# Patient Record
Sex: Female | Born: 1945 | Race: White | Hispanic: Yes | Marital: Married | Smoking: Never smoker
Health system: Southern US, Community
[De-identification: ages and names within clinical notes are randomized; demographics above are authoritative.]

---

## 2020-06-04 ENCOUNTER — Other Ambulatory Visit: Payer: Self-pay | Admitting: Internal Medicine

## 2020-06-04 ENCOUNTER — Ambulatory Visit: Payer: Self-pay | Attending: Internal Medicine

## 2020-06-04 DIAGNOSIS — Z23 Encounter for immunization: Secondary | ICD-10-CM

## 2020-06-04 NOTE — Progress Notes (Signed)
   Covid-19 Vaccination Clinic  Name:  Tami Walker    MRN: [id number] DOB: Oct 01, 1945  06/04/2020  [name] was observed post Covid-19 immunization for 15 minutes without incident. She was provided with Vaccine Information Sheet and instruction to access the V-Safe system.   [name] was instructed to call 911 with any severe reactions post vaccine: Marland Kitchen Difficulty breathing  . Swelling of face and throat  . A fast heartbeat  . A bad rash all over body  . Dizziness and weakness   Immunizations Administered    Name Date Dose VIS Date Route   Pfizer COVID-19 Vaccine 06/04/2020  9:18 AM 0.3 mL 05/01/2020 Intramuscular   Manufacturer: [employer], [employer]   Lot: [id number]   NDC: [address]-6

## 2020-07-17 ENCOUNTER — Encounter: Payer: Self-pay | Admitting: Internal Medicine

## 2020-07-17 ENCOUNTER — Other Ambulatory Visit: Payer: Self-pay

## 2020-07-17 ENCOUNTER — Ambulatory Visit (INDEPENDENT_AMBULATORY_CARE_PROVIDER_SITE_OTHER): Payer: Medicare HMO | Admitting: Internal Medicine

## 2020-07-17 DIAGNOSIS — E038 Other specified hypothyroidism: Secondary | ICD-10-CM | POA: Insufficient documentation

## 2020-07-17 DIAGNOSIS — E063 Autoimmune thyroiditis: Secondary | ICD-10-CM

## 2020-07-17 DIAGNOSIS — E785 Hyperlipidemia, unspecified: Secondary | ICD-10-CM | POA: Diagnosis not present

## 2020-07-17 DIAGNOSIS — J206 Acute bronchitis due to rhinovirus: Secondary | ICD-10-CM | POA: Diagnosis not present

## 2020-07-17 MED ORDER — ALBUTEROL SULFATE HFA 108 (90 BASE) MCG/ACT IN AERS: 1.0000 | INHALATION_SPRAY | Freq: Every day | RESPIRATORY_TRACT | 3 refills | Status: AC

## 2020-07-17 MED ORDER — ATORVASTATIN CALCIUM 20 MG PO TABS
20.0000 mg | ORAL_TABLET | Freq: Every day | ORAL | 1 refills | Status: DC
Start: 2020-07-17 — End: 2021-01-31

## 2020-07-17 MED ORDER — LEVOTHYROXINE SODIUM 25 MCG PO TABS: 25.0000 ug | ORAL_TABLET | Freq: Every day | ORAL | 1 refills | Status: AC

## 2020-07-17 MED ORDER — IPRATROPIUM BROMIDE 0.02 % IN SOLN: 0.2500 mg | Freq: Four times a day (QID) | RESPIRATORY_TRACT | 6 refills | Status: AC | PRN

## 2020-07-17 NOTE — Assessment & Plan Note (Signed)
Patient has been taking statin for hyperlipidemia.  She was advised to follow low-cholesterol diet.  He was also advised to walk on a daily basis.  He denies any history of chest pain never been a smoker.  She was advised to avoid cheese steak and dairy products

## 2020-07-17 NOTE — Progress Notes (Signed)
New Patient Office Visit  Subjective:  Patient ID: Tami Walker, female    DOB: 23-Sep-1945  Age: 75 y.o. MRN: [id number]  CC:  Chief Complaint  Patient presents with  . New Patient (Initial Visit)    Patient is here to establish new care with pcp    HPI Patient presents for  Chest pain  Sob  , bronchitis  And pneumonia  , seen in  Urgent care , pt does not smoke  Or drink, no h/o  Ht attack , no h/o cva , covid test  Neg  , pt is fully vaccinated  History reviewed. No pertinent past medical history.   Current Outpatient Medications:  .  amoxicillin-clavulanate (AUGMENTIN) 875-125 MG tablet, Take 1 tablet by mouth 2 (two) times daily. For 7 days, Disp: , Rfl:  .  albuterol (VENTOLIN HFA) 108 (90 Base) MCG/ACT inhaler, Inhale 1 puff into the lungs daily., Disp: 6.7 g, Rfl: 3 .  atorvastatin (LIPITOR) 20 MG tablet, Take 1 tablet (20 mg total) by mouth daily., Disp: 90 tablet, Rfl: 1 .  ipratropium (ATROVENT) 0.02 % nebulizer solution, Inhale 1.25 mLs (0.25 mg total) into the lungs 4 (four) times daily as needed., Disp: 2.5 mL, Rfl: 6 .  levothyroxine (SYNTHROID) 25 MCG tablet, Take 1 tablet (25 mcg total) by mouth daily., Disp: 30 tablet, Rfl: 1   History reviewed. No pertinent surgical history.  Family History  Problem Relation Age of Onset  . Hypertension Mother   . Diabetes Sister   . Cancer Brother     Social History   Socioeconomic History  . Marital status: Married    Spouse name: Not on file  . Number of children: Not on file  . Years of education: Not on file  . Highest education level: Not on file  Occupational History  . Not on file  Tobacco Use  . Smoking status: Never Smoker  . Smokeless tobacco: Never Used  Substance and Sexual Activity  . Alcohol use: Never  . Drug use: Never  . Sexual activity: Not Currently    Partners: Male    Birth control/protection: None  Other Topics Concern  . Not on file  Social History Narrative  . Not on file   Social  Determinants of Health   Financial Resource Strain: Not on file  Food Insecurity: Not on file  Transportation Needs: Not on file  Physical Activity: Not on file  Stress: Not on file  Social Connections: Not on file  Intimate Partner Violence: Not on file    ROS Review of Systems  Constitutional: Negative.  Negative for chills and diaphoresis.  HENT: Negative.  Negative for congestion, nosebleeds and sinus pressure.   Eyes: Negative.  Negative for pain.  Respiratory: Positive for cough and shortness of breath.   Cardiovascular: Negative.   Gastrointestinal: Negative.   Endocrine: Negative.   Genitourinary: Negative.   Musculoskeletal: Negative.   Skin: Negative.   Allergic/Immunologic: Negative.   Neurological: Negative.   Hematological: Negative.   Psychiatric/Behavioral: Negative.   All other systems reviewed and are negative.   Objective:   Today's Vitals: There were no vitals taken for this visit.  Physical Exam Constitutional:      Appearance: She is normal weight.  HENT:     Head: Normocephalic.     Right Ear: Tympanic membrane normal.     Nose: No congestion.     Mouth/Throat:     Mouth: Mucous membranes are moist.  Eyes:  Pupils: Pupils are equal, round, and reactive to light.  Cardiovascular:     Heart sounds: No murmur heard.   Pulmonary:     Breath sounds: Rhonchi present. No rales.  Abdominal:     General: There is no distension.     Palpations: Abdomen is soft. There is no mass.     Tenderness: There is no abdominal tenderness. There is no guarding or rebound.     Hernia: No hernia is present.  Musculoskeletal:        General: No swelling.     Cervical back: No rigidity.  Skin:    Coloration: Skin is not jaundiced.     Findings: No bruising or lesion.  Neurological:     General: No focal deficit present.     Mental Status: She is alert.  Psychiatric:        Mood and Affect: Mood normal.        Behavior: Behavior normal.      Assessment & Plan:   Problem List Items Addressed This Visit      Respiratory   Acute bronchitis due to Rhinovirus - Primary    Patient was seen in the office with acute bronchitis she is on amoxicillin and she is doing better she is not coughing that much.  She is also using the inhaler I will see her back in 1 week for recheck.        Endocrine   Hypothyroidism due to Hashimoto's thyroiditis    Patient is taking levothyroxine 25 mcg p.o. daily.  Her medicines were refilled.  She does not have any goiter on physical examination.  When she got better we will do a TSH again.      Relevant Medications   levothyroxine (SYNTHROID) 25 MCG tablet     Other   Hyperlipidemia    Patient has been taking statin for hyperlipidemia.  She was advised to follow low-cholesterol diet.  He was also advised to walk on a daily basis.  He denies any history of chest pain never been a smoker.  She was advised to avoid cheese steak and dairy products      Relevant Medications   atorvastatin (LIPITOR) 20 MG tablet      Outpatient Encounter Medications as of 07/17/2020  Medication Sig  . amoxicillin-clavulanate (AUGMENTIN) 875-125 MG tablet Take 1 tablet by mouth 2 (two) times daily. For 7 days  . [DISCONTINUED] albuterol (VENTOLIN HFA) 108 (90 Base) MCG/ACT inhaler Inhale 1 puff into the lungs daily.  . [DISCONTINUED] levothyroxine (SYNTHROID) 25 MCG tablet Take 25 mcg by mouth daily.  Marland Kitchen albuterol (VENTOLIN HFA) 108 (90 Base) MCG/ACT inhaler Inhale 1 puff into the lungs daily.  Marland Kitchen atorvastatin (LIPITOR) 20 MG tablet Take 1 tablet (20 mg total) by mouth daily.  Marland Kitchen ipratropium (ATROVENT) 0.02 % nebulizer solution Inhale 1.25 mLs (0.25 mg total) into the lungs 4 (four) times daily as needed.  Marland Kitchen levothyroxine (SYNTHROID) 25 MCG tablet Take 1 tablet (25 mcg total) by mouth daily.  . [DISCONTINUED] atorvastatin (LIPITOR) 20 MG tablet Take 20 mg by mouth daily.  . [DISCONTINUED] ipratropium (ATROVENT)  0.02 % nebulizer solution Inhale 0.02 mLs into the lungs 4 (four) times daily as needed.   No facility-administered encounter medications on file as of 07/17/2020.    Follow-up: No follow-ups on file.  Patient will be seen back in my office in 1 week.  We will listen to the chest for clearing of the wheezing.  She does not smoke  or drink.  At the present time she is not in any acute distress.  Corky Downs, MD

## 2020-07-17 NOTE — Assessment & Plan Note (Signed)
Patient was seen in the office with acute bronchitis she is on amoxicillin and she is doing better she is not coughing that much.  She is also using the inhaler I will see her back in 1 week for recheck.

## 2020-07-17 NOTE — Assessment & Plan Note (Signed)
Patient is taking levothyroxine 25 mcg p.o. daily.  Her medicines were refilled.  She does not have any goiter on physical examination.  When she got better we will do a TSH again.

## 2020-07-24 ENCOUNTER — Ambulatory Visit: Payer: Medicare HMO | Admitting: Internal Medicine

## 2020-07-31 ENCOUNTER — Other Ambulatory Visit: Payer: Self-pay | Admitting: Infectious Diseases

## 2020-07-31 DIAGNOSIS — E039 Hypothyroidism, unspecified: Secondary | ICD-10-CM

## 2020-07-31 DIAGNOSIS — J189 Pneumonia, unspecified organism: Secondary | ICD-10-CM

## 2020-08-05 ENCOUNTER — Ambulatory Visit: Payer: Medicare HMO

## 2020-08-08 ENCOUNTER — Ambulatory Visit
Admission: RE | Admit: 2020-08-08 | Discharge: 2020-08-08 | Disposition: A | Discharge status: Home / Self Care | Payer: Medicare HMO | Source: Ambulatory Visit | Attending: Infectious Diseases | Admitting: Infectious Diseases

## 2020-08-08 ENCOUNTER — Other Ambulatory Visit: Payer: Self-pay

## 2020-08-08 DIAGNOSIS — J189 Pneumonia, unspecified organism: Secondary | ICD-10-CM

## 2020-08-08 DIAGNOSIS — E039 Hypothyroidism, unspecified: Secondary | ICD-10-CM | POA: Diagnosis present

## 2020-08-19 ENCOUNTER — Ambulatory Visit: Payer: Medicare HMO | Admitting: Internal Medicine

## 2020-08-19 ENCOUNTER — Other Ambulatory Visit: Payer: Self-pay | Admitting: Ophthalmology

## 2020-08-19 DIAGNOSIS — G453 Amaurosis fugax: Secondary | ICD-10-CM

## 2020-08-22 ENCOUNTER — Other Ambulatory Visit: Payer: Self-pay

## 2020-08-22 ENCOUNTER — Ambulatory Visit
Admission: RE | Admit: 2020-08-22 | Discharge: 2020-08-22 | Disposition: A | Discharge status: Home / Self Care | Payer: Medicare HMO | Source: Ambulatory Visit | Attending: Ophthalmology | Admitting: Ophthalmology

## 2020-08-22 DIAGNOSIS — G453 Amaurosis fugax: Secondary | ICD-10-CM

## 2020-09-10 DIAGNOSIS — I208 Other forms of angina pectoris: Secondary | ICD-10-CM | POA: Diagnosis not present

## 2020-09-10 DIAGNOSIS — E782 Mixed hyperlipidemia: Secondary | ICD-10-CM | POA: Diagnosis not present

## 2020-09-10 DIAGNOSIS — I25118 Atherosclerotic heart disease of native coronary artery with other forms of angina pectoris: Secondary | ICD-10-CM | POA: Diagnosis not present

## 2020-09-10 DIAGNOSIS — I10 Essential (primary) hypertension: Secondary | ICD-10-CM | POA: Diagnosis not present

## 2020-09-10 DIAGNOSIS — Z7689 Persons encountering health services in other specified circumstances: Secondary | ICD-10-CM | POA: Diagnosis not present

## 2020-09-10 DIAGNOSIS — R9431 Abnormal electrocardiogram [ECG] [EKG]: Secondary | ICD-10-CM | POA: Diagnosis not present

## 2020-09-10 DIAGNOSIS — J90 Pleural effusion, not elsewhere classified: Secondary | ICD-10-CM | POA: Diagnosis not present

## 2020-09-19 DIAGNOSIS — I208 Other forms of angina pectoris: Secondary | ICD-10-CM | POA: Diagnosis not present

## 2020-09-20 DIAGNOSIS — R0602 Shortness of breath: Secondary | ICD-10-CM | POA: Diagnosis not present

## 2020-09-23 DIAGNOSIS — J90 Pleural effusion, not elsewhere classified: Secondary | ICD-10-CM | POA: Diagnosis not present

## 2020-09-23 DIAGNOSIS — E039 Hypothyroidism, unspecified: Secondary | ICD-10-CM | POA: Diagnosis not present

## 2020-09-23 DIAGNOSIS — I509 Heart failure, unspecified: Secondary | ICD-10-CM | POA: Diagnosis not present

## 2020-09-23 DIAGNOSIS — G4700 Insomnia, unspecified: Secondary | ICD-10-CM | POA: Diagnosis not present

## 2020-09-23 DIAGNOSIS — E785 Hyperlipidemia, unspecified: Secondary | ICD-10-CM | POA: Diagnosis not present

## 2020-10-09 ENCOUNTER — Other Ambulatory Visit: Payer: Self-pay | Admitting: Internal Medicine

## 2020-10-09 DIAGNOSIS — J849 Interstitial pulmonary disease, unspecified: Secondary | ICD-10-CM

## 2020-10-09 DIAGNOSIS — I272 Pulmonary hypertension, unspecified: Secondary | ICD-10-CM | POA: Diagnosis not present

## 2020-10-09 DIAGNOSIS — E782 Mixed hyperlipidemia: Secondary | ICD-10-CM | POA: Diagnosis not present

## 2020-10-09 DIAGNOSIS — G4730 Sleep apnea, unspecified: Secondary | ICD-10-CM | POA: Diagnosis not present

## 2020-10-09 DIAGNOSIS — R079 Chest pain, unspecified: Secondary | ICD-10-CM | POA: Diagnosis not present

## 2020-10-09 DIAGNOSIS — I10 Essential (primary) hypertension: Secondary | ICD-10-CM | POA: Diagnosis not present

## 2020-10-09 DIAGNOSIS — I2510 Atherosclerotic heart disease of native coronary artery without angina pectoris: Secondary | ICD-10-CM | POA: Diagnosis not present

## 2020-10-22 DIAGNOSIS — E039 Hypothyroidism, unspecified: Secondary | ICD-10-CM | POA: Diagnosis not present

## 2020-10-22 DIAGNOSIS — J90 Pleural effusion, not elsewhere classified: Secondary | ICD-10-CM | POA: Diagnosis not present

## 2020-10-22 DIAGNOSIS — I509 Heart failure, unspecified: Secondary | ICD-10-CM | POA: Diagnosis not present

## 2020-10-25 ENCOUNTER — Ambulatory Visit
Admission: RE | Admit: 2020-10-25 | Discharge: 2020-10-25 | Disposition: A | Discharge status: Home / Self Care | Payer: Medicare HMO | Source: Ambulatory Visit | Attending: Internal Medicine | Admitting: Internal Medicine

## 2020-10-25 ENCOUNTER — Other Ambulatory Visit: Payer: Self-pay

## 2020-10-25 DIAGNOSIS — J849 Interstitial pulmonary disease, unspecified: Secondary | ICD-10-CM

## 2020-10-25 DIAGNOSIS — Z8701 Personal history of pneumonia (recurrent): Secondary | ICD-10-CM | POA: Diagnosis not present

## 2020-10-25 DIAGNOSIS — R0609 Other forms of dyspnea: Secondary | ICD-10-CM | POA: Diagnosis not present

## 2020-10-25 DIAGNOSIS — J8410 Pulmonary fibrosis, unspecified: Secondary | ICD-10-CM | POA: Diagnosis not present

## 2020-10-25 DIAGNOSIS — I2510 Atherosclerotic heart disease of native coronary artery without angina pectoris: Secondary | ICD-10-CM | POA: Diagnosis not present

## 2020-11-04 DIAGNOSIS — J90 Pleural effusion, not elsewhere classified: Secondary | ICD-10-CM | POA: Diagnosis not present

## 2021-01-06 DIAGNOSIS — E782 Mixed hyperlipidemia: Secondary | ICD-10-CM | POA: Diagnosis not present

## 2021-01-06 DIAGNOSIS — I10 Essential (primary) hypertension: Secondary | ICD-10-CM | POA: Diagnosis not present

## 2021-01-06 DIAGNOSIS — I2510 Atherosclerotic heart disease of native coronary artery without angina pectoris: Secondary | ICD-10-CM | POA: Diagnosis not present

## 2021-01-06 DIAGNOSIS — I272 Pulmonary hypertension, unspecified: Secondary | ICD-10-CM | POA: Diagnosis not present

## 2021-01-21 DIAGNOSIS — I340 Nonrheumatic mitral (valve) insufficiency: Secondary | ICD-10-CM | POA: Diagnosis not present

## 2021-01-21 DIAGNOSIS — R109 Unspecified abdominal pain: Secondary | ICD-10-CM | POA: Diagnosis not present

## 2021-01-21 DIAGNOSIS — I272 Pulmonary hypertension, unspecified: Secondary | ICD-10-CM | POA: Diagnosis not present

## 2021-01-21 DIAGNOSIS — I10 Essential (primary) hypertension: Secondary | ICD-10-CM | POA: Diagnosis not present

## 2021-01-21 DIAGNOSIS — K7460 Unspecified cirrhosis of liver: Secondary | ICD-10-CM | POA: Diagnosis not present

## 2021-01-21 DIAGNOSIS — I110 Hypertensive heart disease with heart failure: Secondary | ICD-10-CM | POA: Diagnosis not present

## 2021-01-21 DIAGNOSIS — I509 Heart failure, unspecified: Secondary | ICD-10-CM | POA: Diagnosis not present

## 2021-01-21 DIAGNOSIS — E039 Hypothyroidism, unspecified: Secondary | ICD-10-CM | POA: Diagnosis not present

## 2021-01-21 DIAGNOSIS — R1031 Right lower quadrant pain: Secondary | ICD-10-CM | POA: Diagnosis not present

## 2021-01-21 DIAGNOSIS — E785 Hyperlipidemia, unspecified: Secondary | ICD-10-CM | POA: Diagnosis not present

## 2021-01-31 ENCOUNTER — Other Ambulatory Visit: Payer: Self-pay | Admitting: Internal Medicine

## 2021-02-11 ENCOUNTER — Other Ambulatory Visit
Admission: RE | Admit: 2021-02-11 | Discharge: 2021-02-11 | Disposition: A | Discharge status: Home / Self Care | Payer: Medicare HMO | Source: Ambulatory Visit | Attending: Infectious Diseases | Admitting: Infectious Diseases

## 2021-02-11 ENCOUNTER — Other Ambulatory Visit: Payer: Self-pay | Admitting: Gastroenterology

## 2021-02-11 DIAGNOSIS — I10 Essential (primary) hypertension: Secondary | ICD-10-CM | POA: Diagnosis not present

## 2021-02-11 DIAGNOSIS — R932 Abnormal findings on diagnostic imaging of liver and biliary tract: Secondary | ICD-10-CM | POA: Diagnosis not present

## 2021-02-11 DIAGNOSIS — R0602 Shortness of breath: Secondary | ICD-10-CM | POA: Insufficient documentation

## 2021-02-11 DIAGNOSIS — R748 Abnormal levels of other serum enzymes: Secondary | ICD-10-CM | POA: Diagnosis not present

## 2021-02-11 DIAGNOSIS — I272 Pulmonary hypertension, unspecified: Secondary | ICD-10-CM | POA: Diagnosis not present

## 2021-02-11 DIAGNOSIS — I509 Heart failure, unspecified: Secondary | ICD-10-CM | POA: Diagnosis not present

## 2021-02-11 DIAGNOSIS — I081 Rheumatic disorders of both mitral and tricuspid valves: Secondary | ICD-10-CM | POA: Diagnosis not present

## 2021-02-11 DIAGNOSIS — R945 Abnormal results of liver function studies: Secondary | ICD-10-CM | POA: Diagnosis not present

## 2021-02-11 LAB — BRAIN NATRIURETIC PEPTIDE: B Natriuretic Peptide: 531.4 pg/mL — ABNORMAL HIGH (ref 0.0–100.0)

## 2021-02-17 ENCOUNTER — Other Ambulatory Visit
Admission: RE | Admit: 2021-02-17 | Discharge: 2021-02-17 | Disposition: A | Discharge status: Home / Self Care | Payer: Medicare HMO | Source: Ambulatory Visit | Attending: Infectious Diseases | Admitting: Infectious Diseases

## 2021-02-17 DIAGNOSIS — I340 Nonrheumatic mitral (valve) insufficiency: Secondary | ICD-10-CM | POA: Insufficient documentation

## 2021-02-17 DIAGNOSIS — I10 Essential (primary) hypertension: Secondary | ICD-10-CM | POA: Insufficient documentation

## 2021-02-17 DIAGNOSIS — R0602 Shortness of breath: Secondary | ICD-10-CM | POA: Insufficient documentation

## 2021-02-17 DIAGNOSIS — I5030 Unspecified diastolic (congestive) heart failure: Secondary | ICD-10-CM | POA: Diagnosis not present

## 2021-02-17 DIAGNOSIS — I272 Pulmonary hypertension, unspecified: Secondary | ICD-10-CM | POA: Diagnosis not present

## 2021-02-17 DIAGNOSIS — I700 Atherosclerosis of aorta: Secondary | ICD-10-CM | POA: Diagnosis not present

## 2021-02-17 LAB — BRAIN NATRIURETIC PEPTIDE: B Natriuretic Peptide: 238 pg/mL — ABNORMAL HIGH (ref 0.0–100.0)

## 2021-02-27 ENCOUNTER — Ambulatory Visit
Admission: RE | Admit: 2021-02-27 | Discharge: 2021-02-27 | Disposition: A | Discharge status: Home / Self Care | Payer: Medicare HMO | Source: Ambulatory Visit | Attending: Gastroenterology | Admitting: Gastroenterology

## 2021-02-27 DIAGNOSIS — R932 Abnormal findings on diagnostic imaging of liver and biliary tract: Secondary | ICD-10-CM | POA: Diagnosis not present

## 2021-02-27 DIAGNOSIS — R748 Abnormal levels of other serum enzymes: Secondary | ICD-10-CM | POA: Insufficient documentation

## 2021-02-27 DIAGNOSIS — K760 Fatty (change of) liver, not elsewhere classified: Secondary | ICD-10-CM | POA: Diagnosis not present

## 2021-03-06 DIAGNOSIS — E782 Mixed hyperlipidemia: Secondary | ICD-10-CM | POA: Diagnosis not present

## 2021-03-06 DIAGNOSIS — I272 Pulmonary hypertension, unspecified: Secondary | ICD-10-CM | POA: Diagnosis not present

## 2021-03-06 DIAGNOSIS — I509 Heart failure, unspecified: Secondary | ICD-10-CM | POA: Diagnosis not present

## 2021-03-06 DIAGNOSIS — K760 Fatty (change of) liver, not elsewhere classified: Secondary | ICD-10-CM | POA: Diagnosis not present

## 2021-03-06 DIAGNOSIS — I10 Essential (primary) hypertension: Secondary | ICD-10-CM | POA: Diagnosis not present

## 2021-04-01 DIAGNOSIS — I2510 Atherosclerotic heart disease of native coronary artery without angina pectoris: Secondary | ICD-10-CM | POA: Diagnosis not present

## 2021-04-01 DIAGNOSIS — J90 Pleural effusion, not elsewhere classified: Secondary | ICD-10-CM | POA: Diagnosis not present

## 2021-04-01 DIAGNOSIS — I10 Essential (primary) hypertension: Secondary | ICD-10-CM | POA: Diagnosis not present

## 2021-04-01 DIAGNOSIS — E782 Mixed hyperlipidemia: Secondary | ICD-10-CM | POA: Diagnosis not present

## 2021-04-01 DIAGNOSIS — I272 Pulmonary hypertension, unspecified: Secondary | ICD-10-CM | POA: Diagnosis not present

## 2021-04-01 DIAGNOSIS — I340 Nonrheumatic mitral (valve) insufficiency: Secondary | ICD-10-CM | POA: Diagnosis not present

## 2021-04-23 DIAGNOSIS — I081 Rheumatic disorders of both mitral and tricuspid valves: Secondary | ICD-10-CM | POA: Diagnosis not present

## 2021-04-23 DIAGNOSIS — E8889 Other specified metabolic disorders: Secondary | ICD-10-CM | POA: Diagnosis not present

## 2021-04-23 DIAGNOSIS — I509 Heart failure, unspecified: Secondary | ICD-10-CM | POA: Diagnosis not present

## 2021-04-23 DIAGNOSIS — Z8679 Personal history of other diseases of the circulatory system: Secondary | ICD-10-CM | POA: Diagnosis not present

## 2021-04-23 DIAGNOSIS — E785 Hyperlipidemia, unspecified: Secondary | ICD-10-CM | POA: Diagnosis not present

## 2021-06-03 DIAGNOSIS — K760 Fatty (change of) liver, not elsewhere classified: Secondary | ICD-10-CM | POA: Diagnosis not present

## 2021-09-09 ENCOUNTER — Other Ambulatory Visit: Payer: Self-pay | Admitting: Gastroenterology

## 2021-09-09 DIAGNOSIS — K760 Fatty (change of) liver, not elsewhere classified: Secondary | ICD-10-CM

## 2021-09-09 DIAGNOSIS — R1031 Right lower quadrant pain: Secondary | ICD-10-CM

## 2021-09-24 ENCOUNTER — Ambulatory Visit
Admission: RE | Admit: 2021-09-24 | Discharge: 2021-09-24 | Disposition: A | Discharge status: Home / Self Care | Payer: Medicare HMO | Source: Ambulatory Visit | Attending: Gastroenterology | Admitting: Gastroenterology

## 2021-09-24 ENCOUNTER — Other Ambulatory Visit: Payer: Self-pay

## 2021-09-24 DIAGNOSIS — R1031 Right lower quadrant pain: Secondary | ICD-10-CM | POA: Insufficient documentation

## 2021-09-24 DIAGNOSIS — K760 Fatty (change of) liver, not elsewhere classified: Secondary | ICD-10-CM | POA: Diagnosis present

## 2021-09-24 LAB — POCT I-STAT CREATININE: Creatinine, Ser: 0.9 mg/dL (ref 0.44–1.00)

## 2021-09-24 MED ORDER — IOHEXOL 300 MG/ML  SOLN
100.0000 mL | Freq: Once | INTRAMUSCULAR | Status: AC | PRN
  Administered 2021-09-24: 100 mL via INTRAVENOUS

## 2022-03-07 IMAGING — US US CAROTID DUPLEX BILAT
1 series · 14 of 24 positions shown · non-contrast
Comparison: None.

CLINICAL DATA: Amaurosis fugax of left eye

EXAM:
BILATERAL CAROTID DUPLEX ULTRASOUND
TECHNIQUE: Gray scale imaging, color Doppler and duplex ultrasound were
performed of bilateral carotid and vertebral arteries in the neck.

[Series 1: us carotid duplex bilat · 14 of 64 slices shown]
[im 1/64]
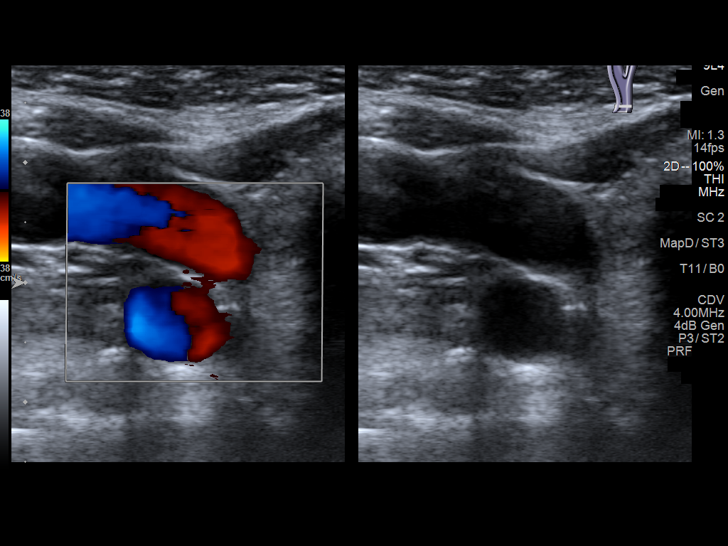
[im 6/64]
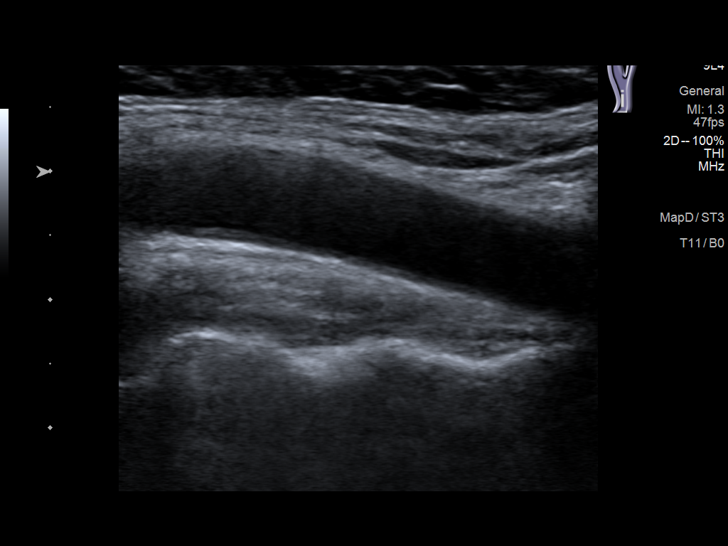
[im 11/64]
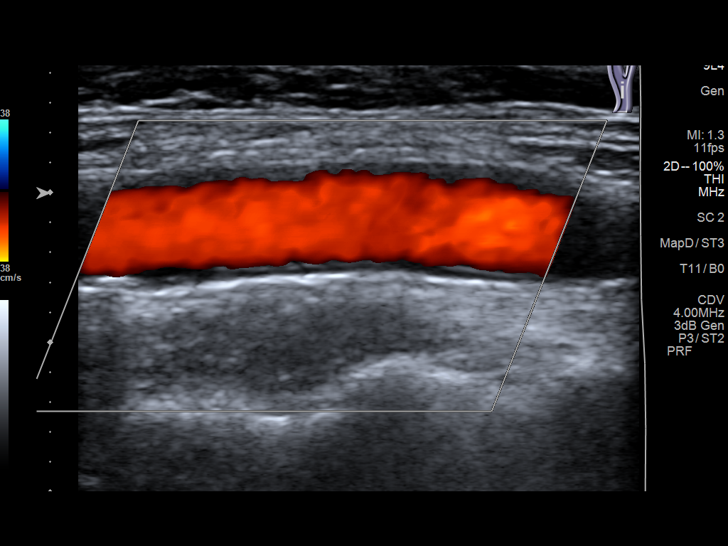
[im 17/64]
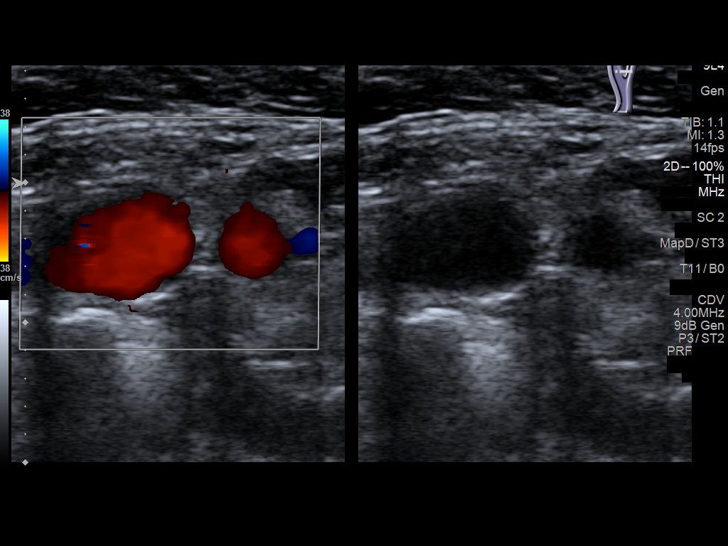
[im 20/64]
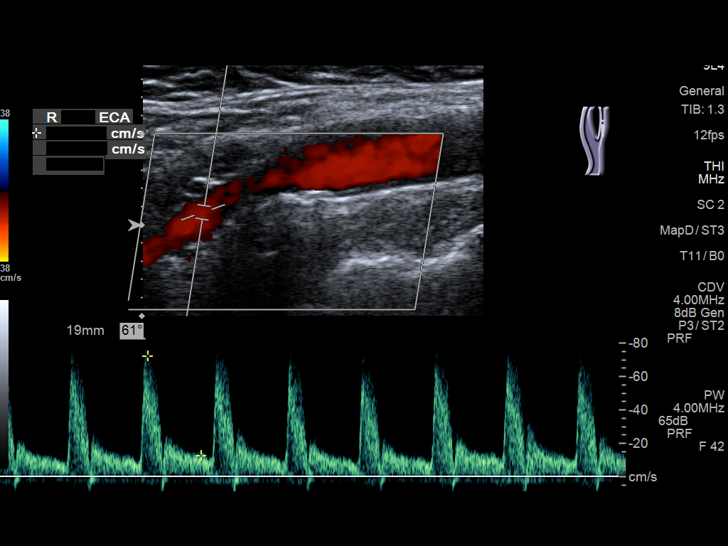
[im 25/64]
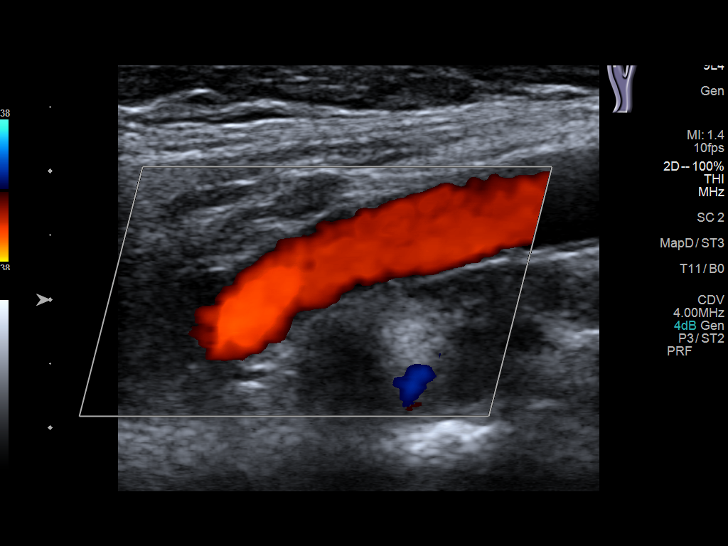
[im 31/64]
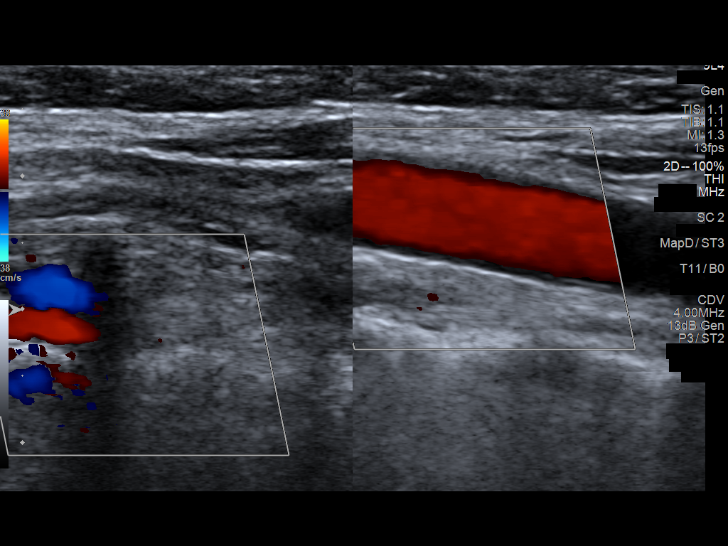
[im 33/64]
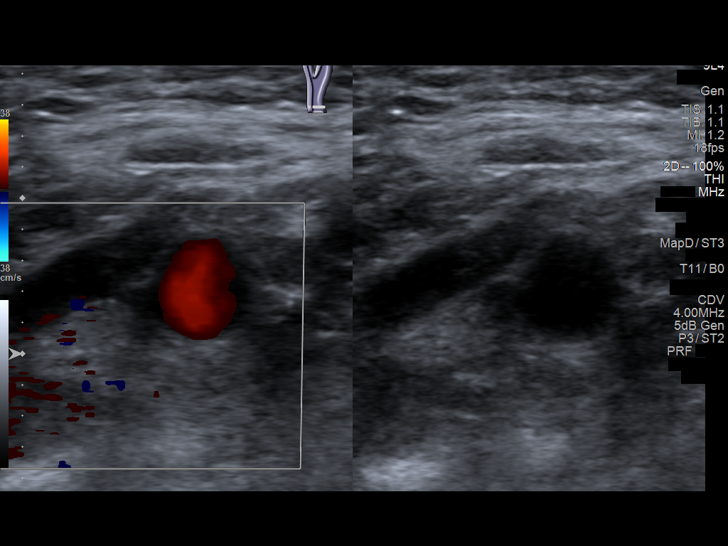
[im 39/64]
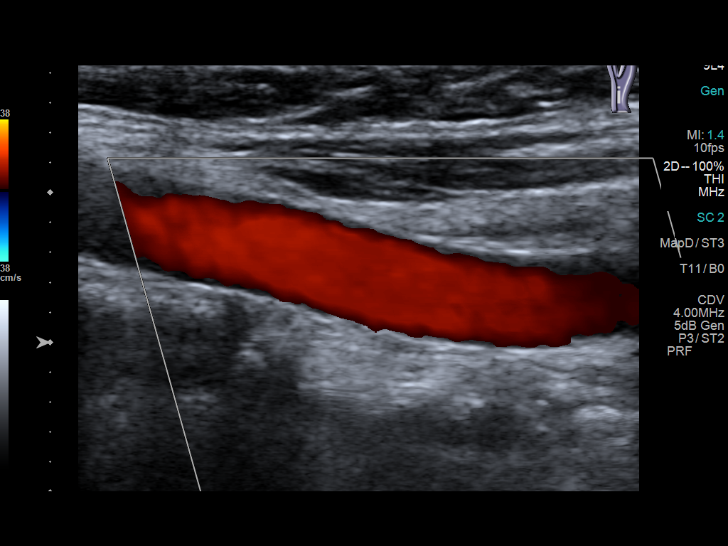
[im 44/64]
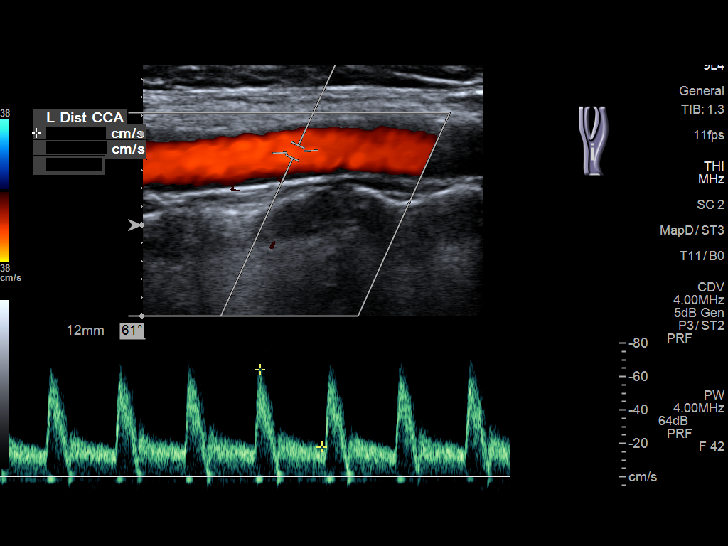
[im 50/64]
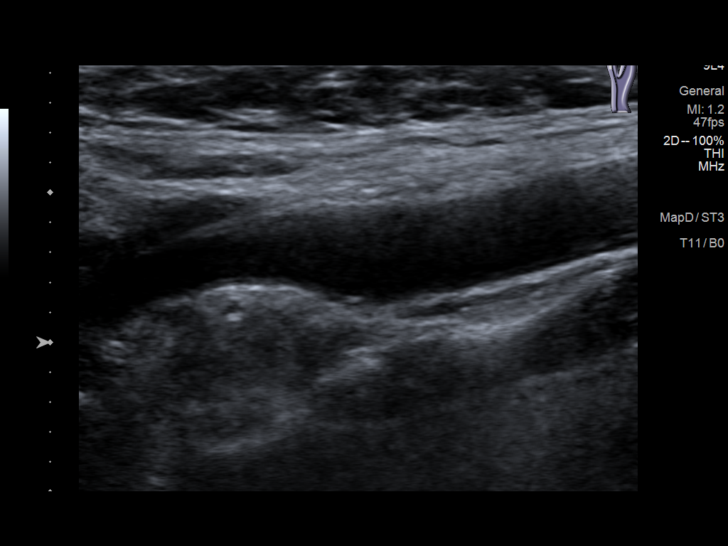
[im 53/64]
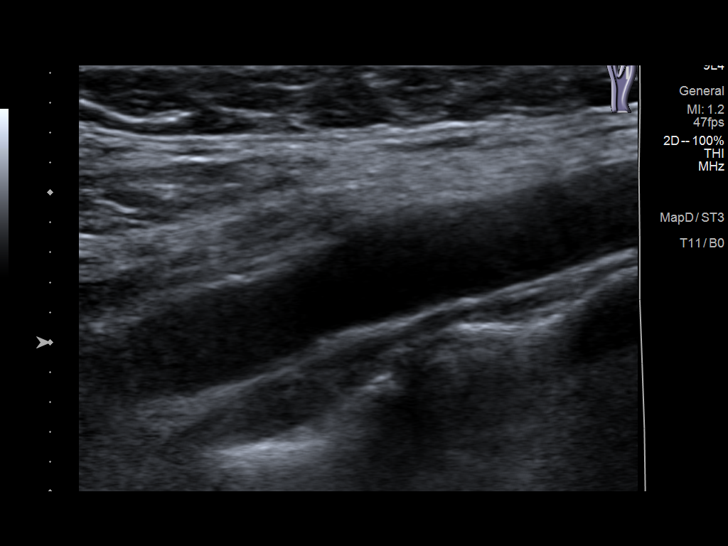
[im 58/64]
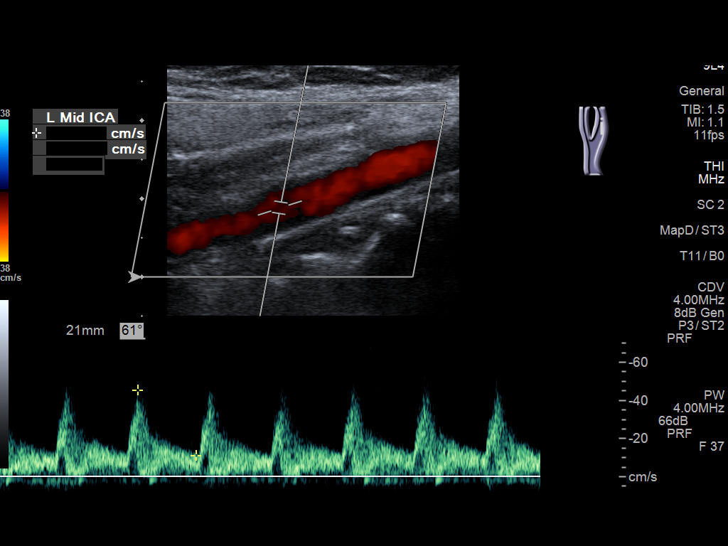
[im 64/64]
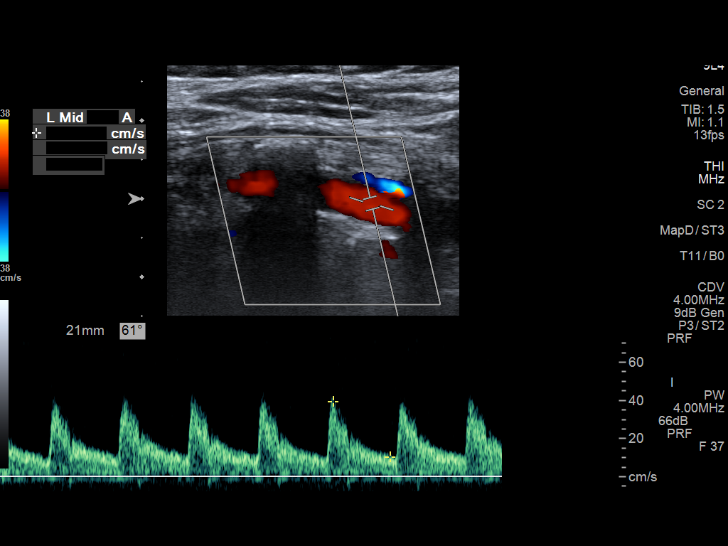

[14 of 24 positions shown; findings below may reference images not displayed]

FINDINGS: Criteria: Quantification of carotid stenosis is based on velocity
parameters that correlate the residual internal carotid diameter
with NASCET-based stenosis levels, using the diameter of the distal
internal carotid lumen as the denominator for stenosis measurement.

The following velocity measurements were obtained:

RIGHT

ICA: 65/23 cm/sec

CCA: 93/17 cm/sec

SYSTOLIC ICA/CCA RATIO:

ECA: 72 cm/sec

LEFT

ICA: 54/18 cm/sec

CCA: 64/18 cm/sec

SYSTOLIC ICA/CCA RATIO:

ECA: 81 cm/sec

RIGHT CAROTID ARTERY: Minimal atheromatous plaque of the internal
carotid artery origin without flow limiting stenosis.

RIGHT VERTEBRAL ARTERY:  Antegrade flow.

LEFT CAROTID ARTERY: Minimal atheromatous plaque of the internal
carotid artery origin without flow-limiting stenosis.

LEFT VERTEBRAL ARTERY:  Antegrade flow.
IMPRESSION: No significant narrowing of internal carotid arteries

## 2023-04-09 IMAGING — CT CT ABD-PELV W/ CM
2 of 5 series · 15 of 46 positions shown, 17 images · IV contrast (agent unspecified)
Comparison: Ultrasound February 27, 2021 and chest CT August 08, 2020

CLINICAL DATA: Intermittent right lower quadrant pain x1 year.

EXAM:
CT ABDOMEN AND PELVIS WITH CONTRAST
TECHNIQUE: Multidetector CT imaging of the abdomen and pelvis was performed
using the standard protocol following bolus administration of
intravenous contrast.

[Series 2: routine abd/pel with · axial · 0.69mm/px · z∈[-995,-635]mm · 12 of 82 slices shown, 14 images]
[im 5/82  soft-tissue]
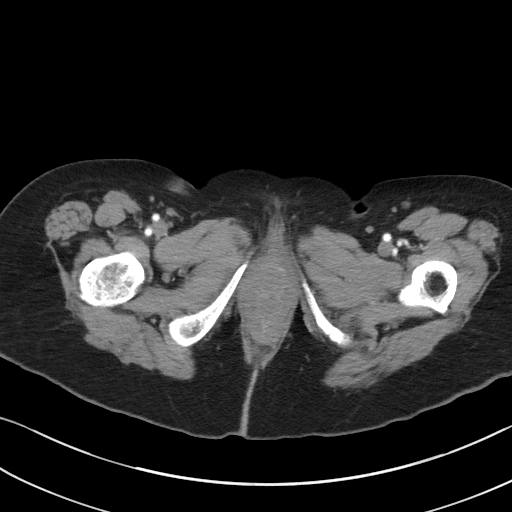
[im 5/82  bone]
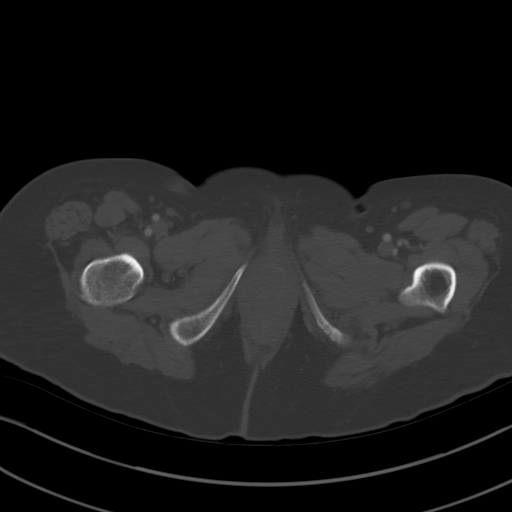
[im 13/82  soft-tissue]
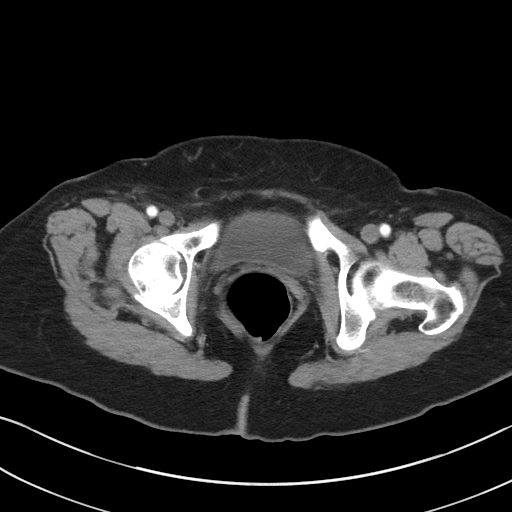
[im 18/82  soft-tissue]
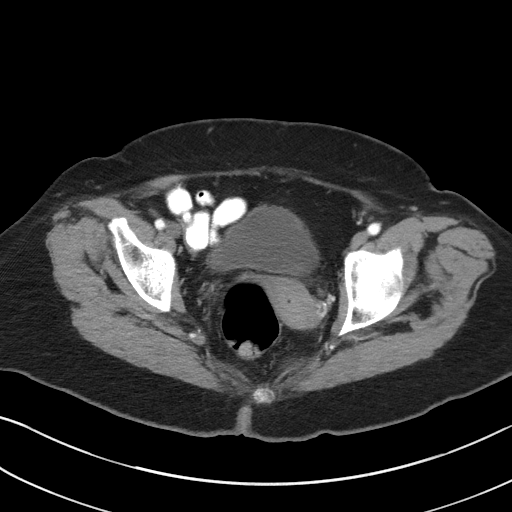
[im 26/82  soft-tissue]
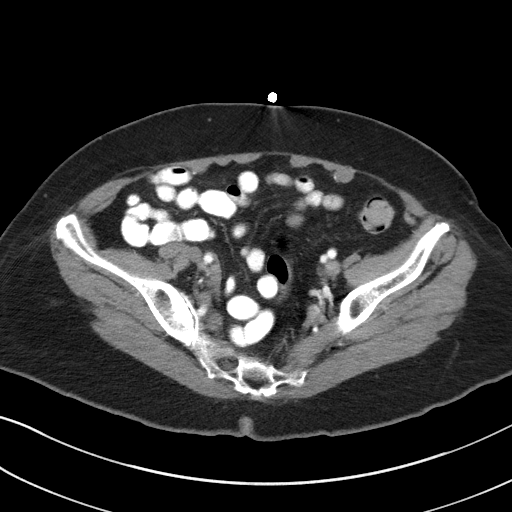
[im 30/82  soft-tissue]
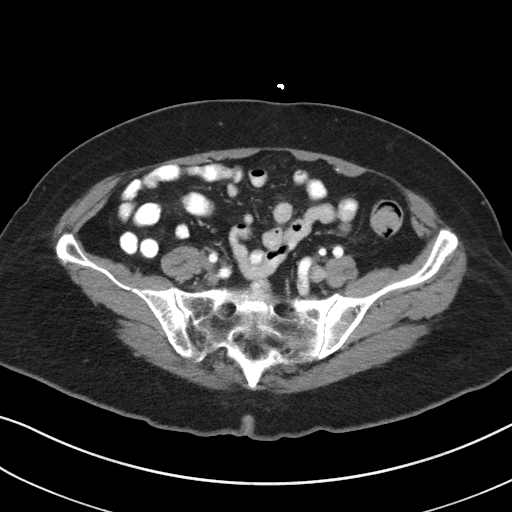
[im 39/82  soft-tissue]
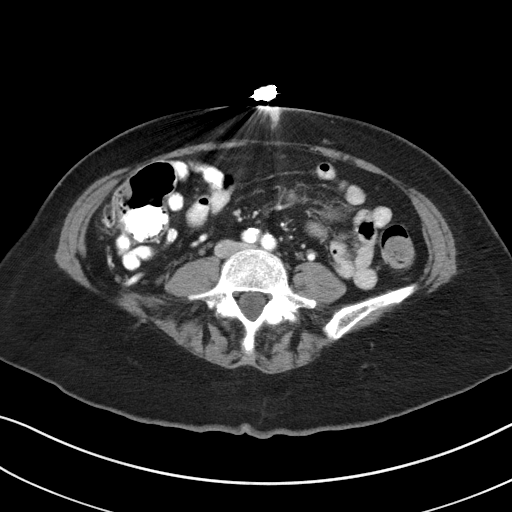
[im 43/82  soft-tissue]
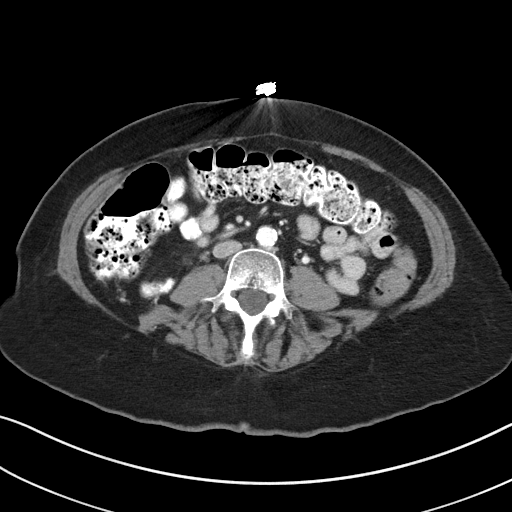
[im 52/82  soft-tissue]
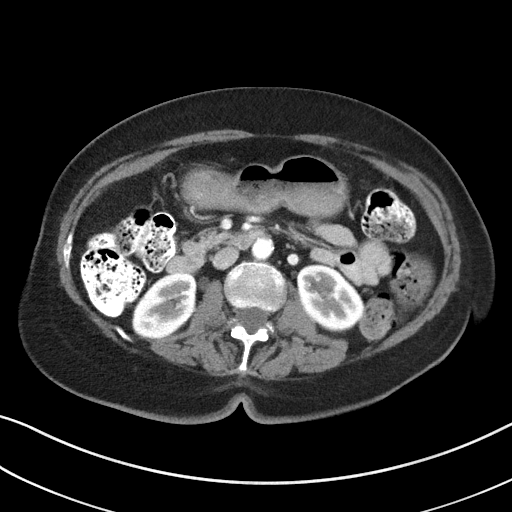
[im 56/82  soft-tissue]
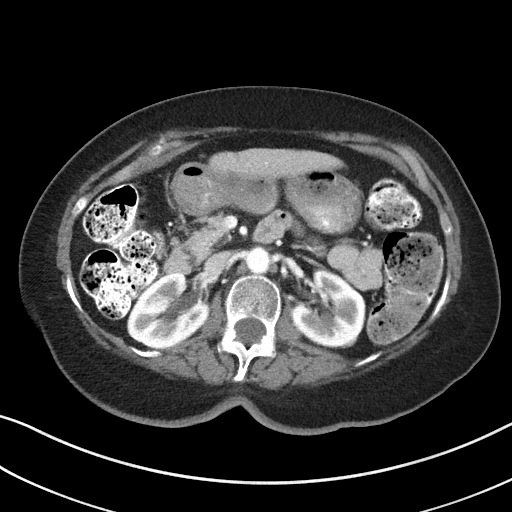
[im 56/82  bone]
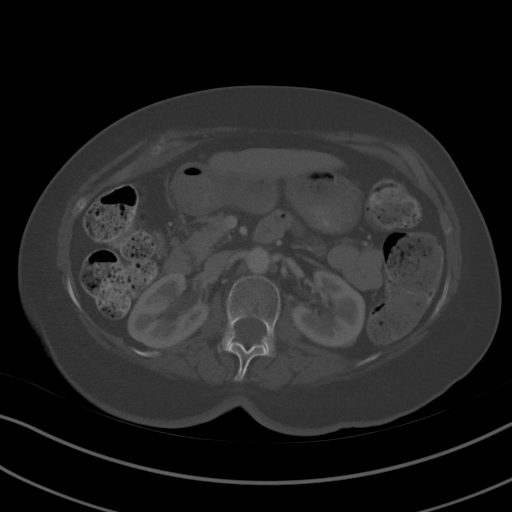
[im 64/82  soft-tissue]
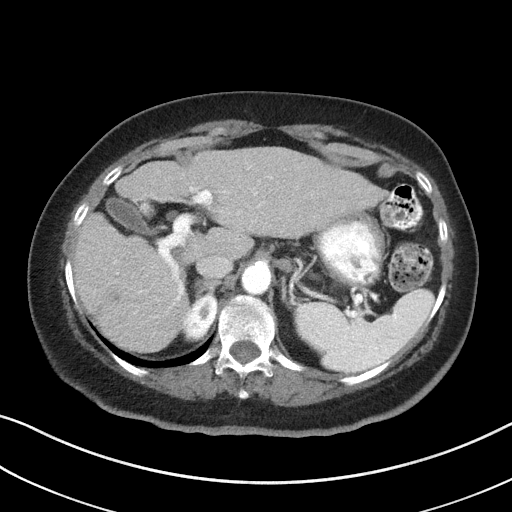
[im 69/82  soft-tissue]
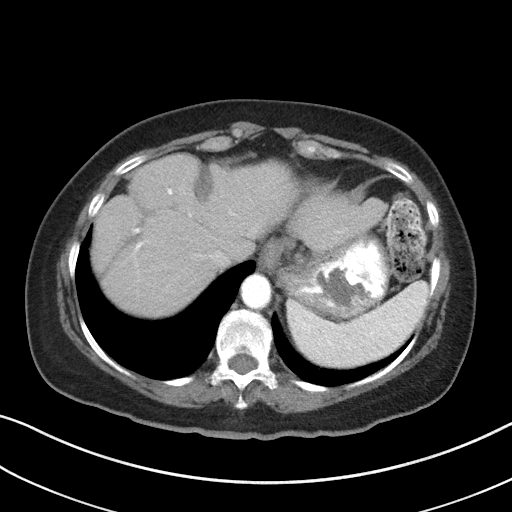
[im 77/82  soft-tissue]
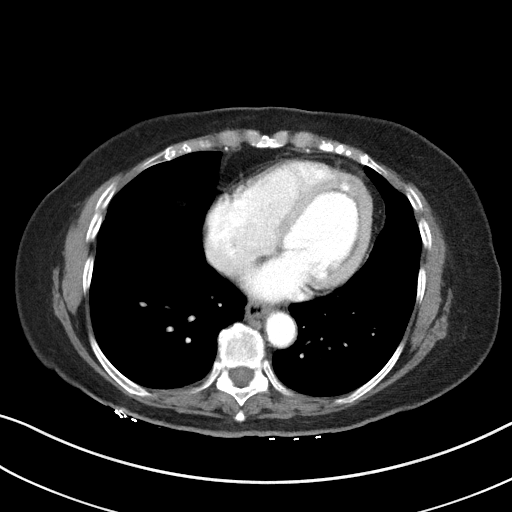

[Series 5: coronal st · coronal · 0.68mm/px · 3 of 73 slices shown]
[im 25/73  soft-tissue]
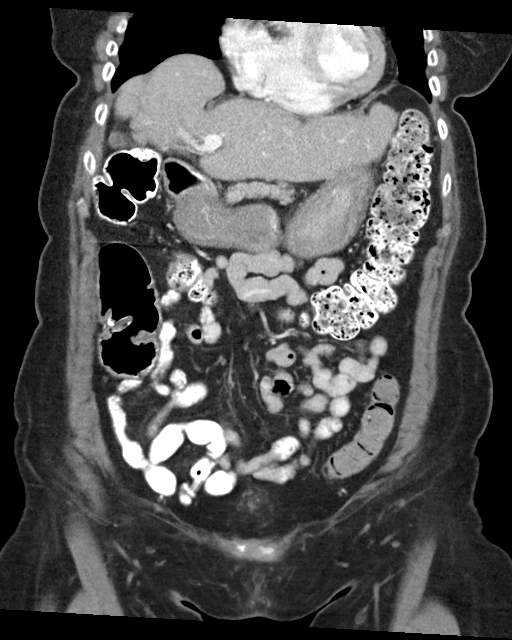
[im 33/73  soft-tissue]
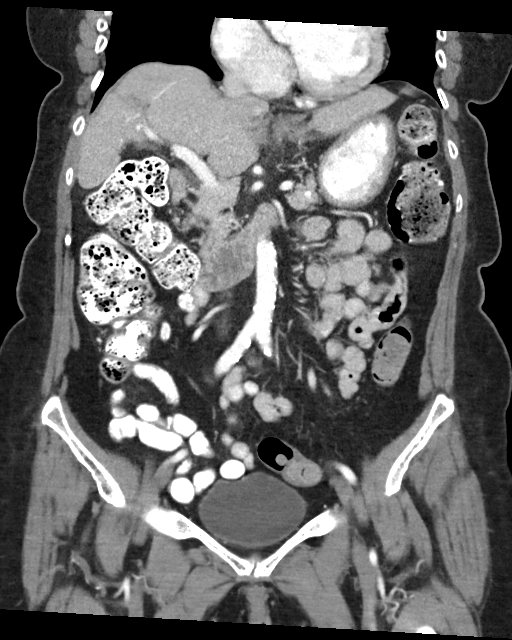
[im 41/73  soft-tissue]
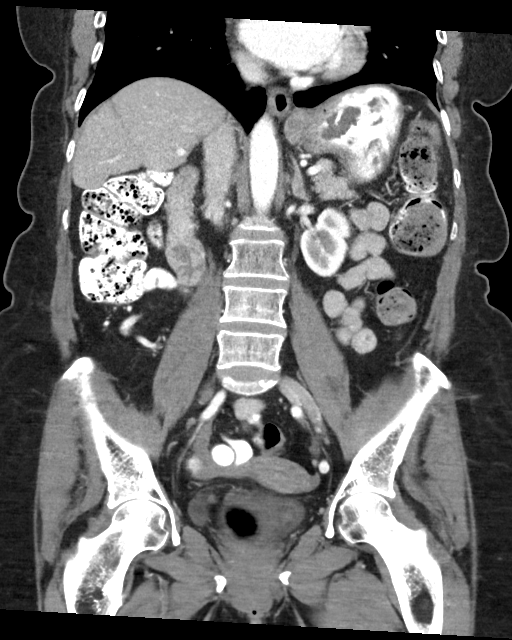

[15 of 46 positions shown; findings below may reference images not displayed]

RADIATION DOSE REDUCTION: This exam was performed according to the
departmental dose-optimization program which includes automated
exposure control, adjustment of the mA and/or kV according to
patient size and/or use of iterative reconstruction technique.

CONTRAST:  100mL OMNIPAQUE IOHEXOL 300 MG/ML  SOLN
FINDINGS: Lower chest: No acute abnormality.

Hepatobiliary: Hypodense 1 cm lesion in the right lobe of the liver
on image [DATE] previously characterized as a cyst on ultrasound
February 27, 2021. Hypodense lesion in the anterior left lobe of the
liver measuring 6 mm on image [DATE] is technically too small to
accurately characterize but statistically likely to reflect this
benign etiology such as a cyst. Nodular hepatic contour with
enlargement of the lateral left lobe and caudate lobes of the liver
suggestive of cirrhosis. There are linear dense bands with
underlying hepatic architectural distortion involving inter lobar
fissure and right lobe of the liver for instance on image [DATE] which
likely reflects fibrosis.

Gallbladder is unremarkable.  No biliary ductal dilation.

Pancreas: No pancreatic ductal dilation or evidence of acute
inflammation.

Spleen: No splenomegaly or focal splenic lesion.

Adrenals/Urinary Tract: Bilateral adrenal glands appear normal.
Kidneys demonstrate symmetric enhancement and excretion of contrast
material. Hypodense subcentimeter right interpolar renal lesion is
technically too small to accurately characterize but statistically
likely to reflect a cyst. Urinary bladder is unremarkable for degree
of distension.

Stomach/Bowel: Radiopaque enteric contrast material traverses the
splenic flexure. Small hiatal hernia otherwise the stomach is
unremarkable for degree of distension. No pathologic dilation of
small or large bowel. Terminal ileum appears normal. Normal
appendix. Moderate volume of formed stool throughout the colon
suggestive of constipation. No evidence of acute bowel inflammation.

Vascular/Lymphatic: Aortic and branch vessel atherosclerosis without
abdominal aortic aneurysm. No pathologically enlarged abdominal or
pelvic lymph nodes.

Reproductive: The uterus and left adnexa are unremarkable. Hypodense
simple appearing 2.8 cm right ovarian cyst on image 61/2. No
follow-up imaging recommended. Note: This recommendation does not
apply to premenarchal patients and to those with increased risk
(genetic, family history, elevated tumor markers or other high-risk
factors) of ovarian cancer. Reference: JACR [DATE]):[id number]

Other: No significant abdominopelvic free fluid. Small fat
containing ventral hernia.

Musculoskeletal: Mild multilevel degenerative changes spine. Chronic
changes of osteitis pubis. No acute osseous abnormality.
IMPRESSION: 1. No acute abnormality in the abdomen or pelvis.
2. Moderate volume of formed stool throughout the colon suggestive
of constipation.
3. Hepatic morphologic changes suggestive of cirrhosis with probable
linear bands of fibrosis.
4.  Aortic Atherosclerosis ([id number]).
